# Patient Record
Sex: Female | Born: 1995 | Race: Black or African American | Hispanic: No | Marital: Single
Health system: Southern US, Community
[De-identification: ages and names within clinical notes are randomized; demographics above are authoritative.]

## PROBLEM LIST (undated history)

## (undated) DIAGNOSIS — F23 Brief psychotic disorder: Secondary | ICD-10-CM

---
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
## 2017-11-09 IMAGING — CT CT HEAD W/O CM
3 of 6 series · 15 of 47 positions shown, 18 images · non-contrast
Comparison: None.

CLINICAL DATA: Altered mental status over the past several days.

EXAM:
CT HEAD WITHOUT CONTRAST
TECHNIQUE: Contiguous axial images were obtained from the base of the skull
through the vertex without intravenous contrast.

[Series 2: head w/o · axial · non-contrast · 0.45mm/px · z∈[+1428,+1543]mm · 9 of 29 slices shown, 12 images]
[im 3/29  brain]
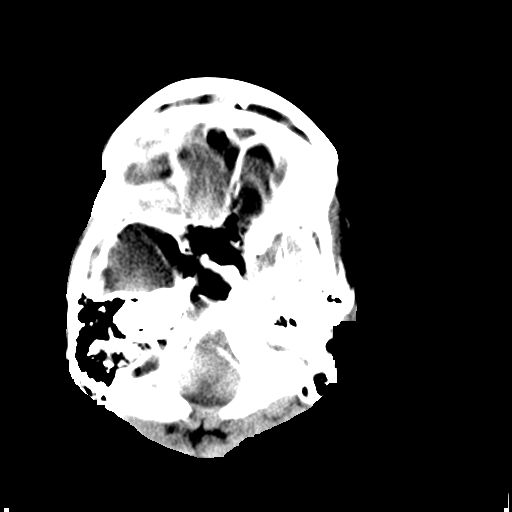
[im 3/29  bone]
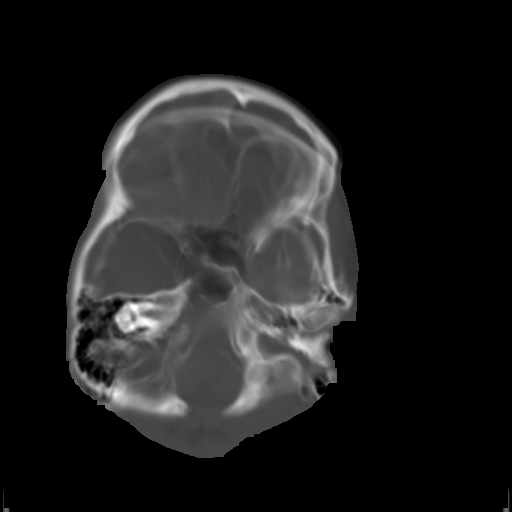
[im 5/29  brain]
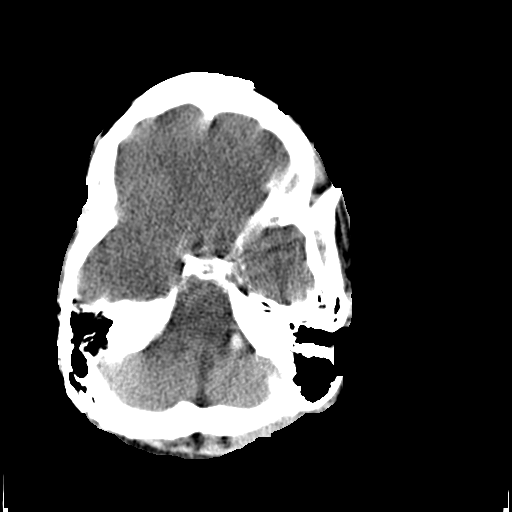
[im 10/29  brain]
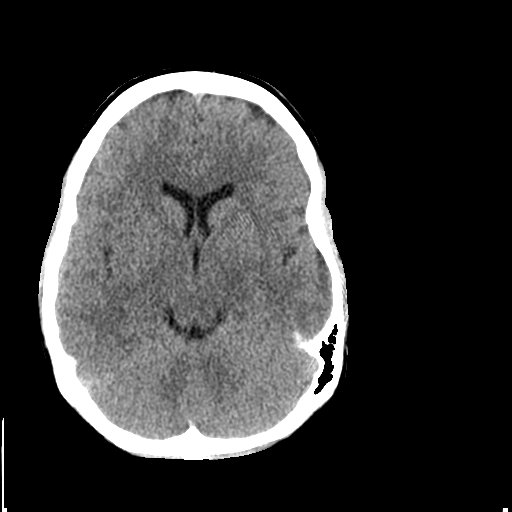
[im 12/29  brain]
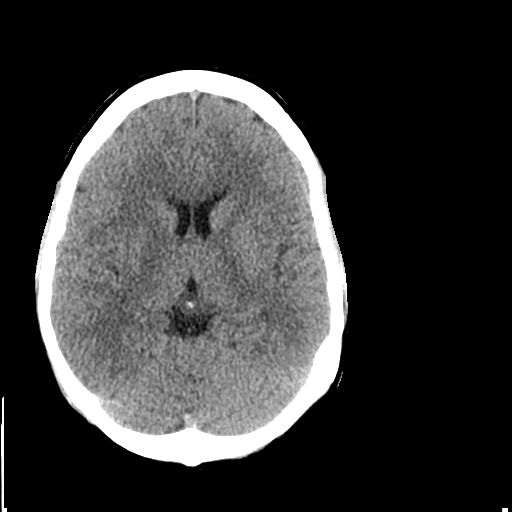
[im 15/29  brain]
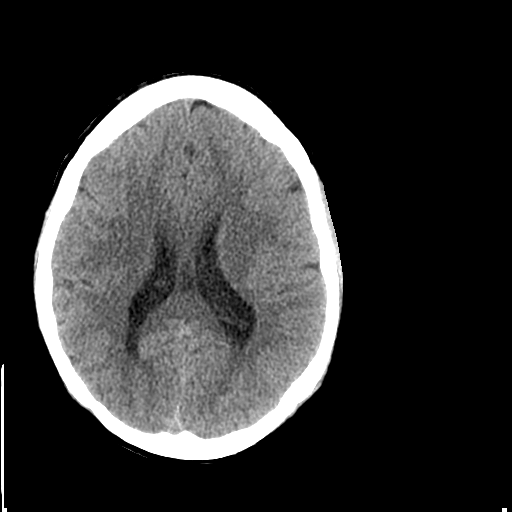
[im 15/29  bone]
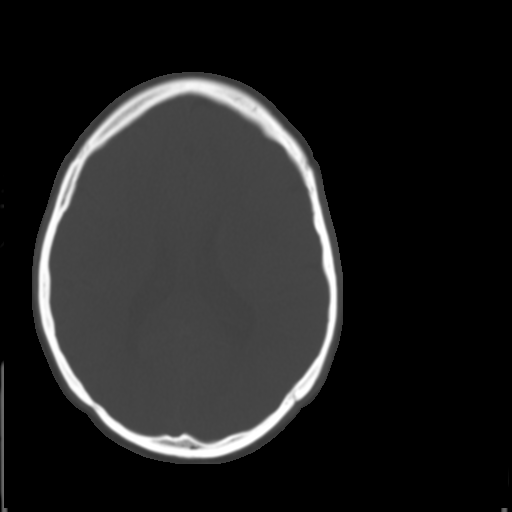
[im 17/29  brain]
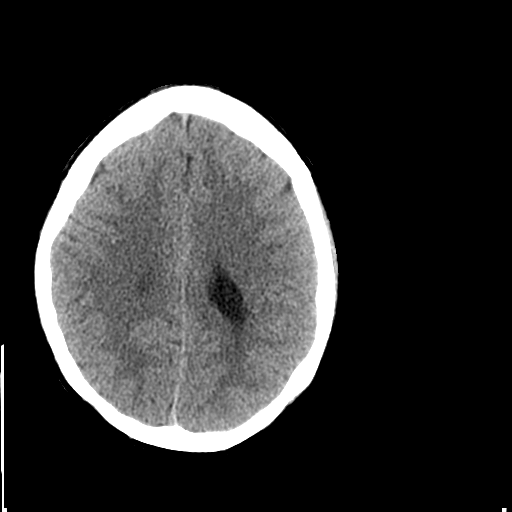
[im 19/29  brain]
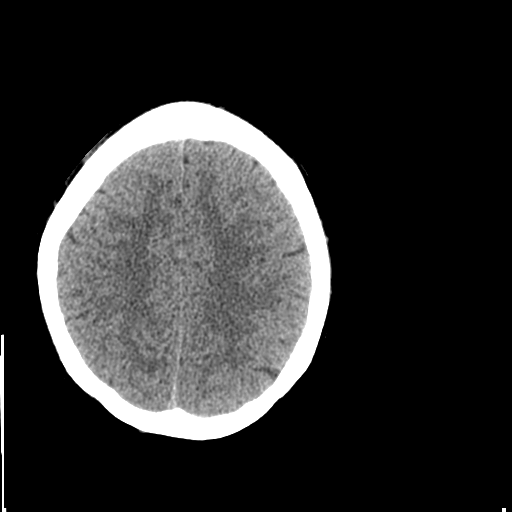
[im 24/29  brain]
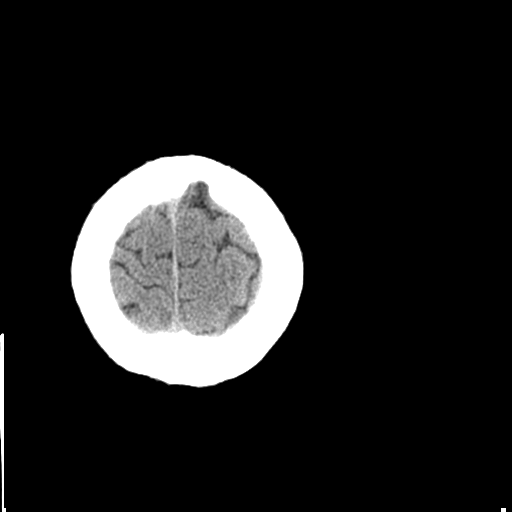
[im 26/29  brain]
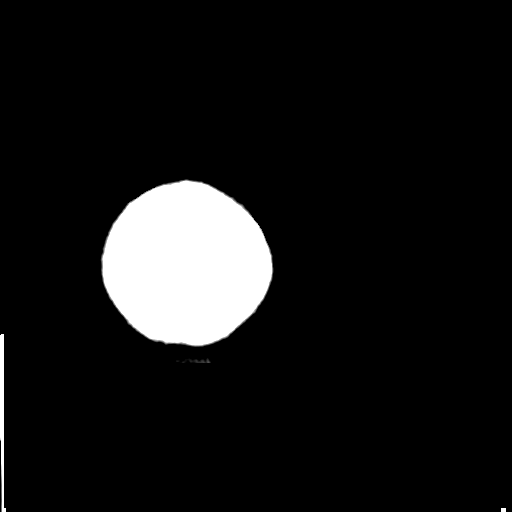
[im 26/29  bone]
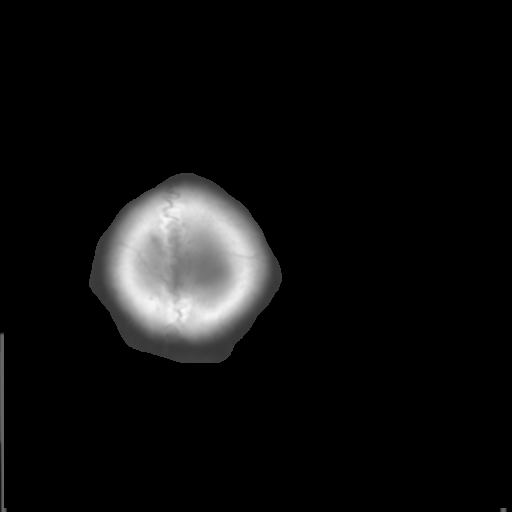

[Series 6: coronal · coronal · 0.29mm/px · 3 of 65 slices shown]
[im 22/65  brain]
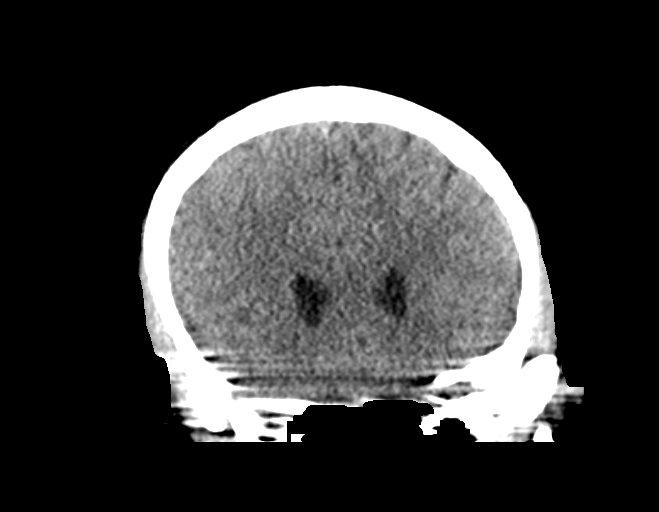
[im 29/65  brain]
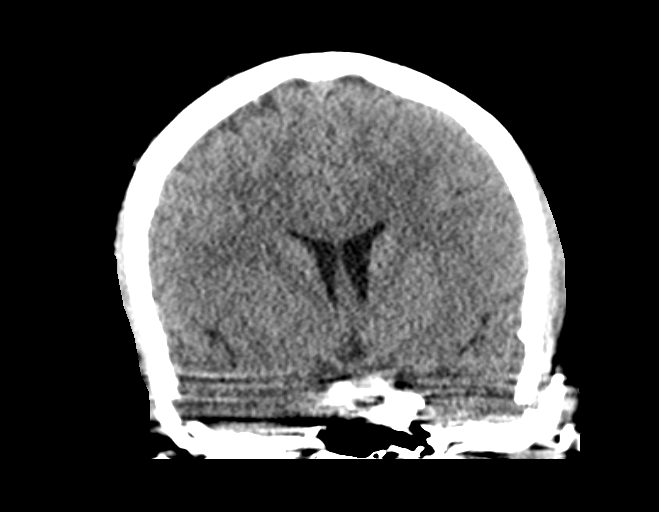
[im 36/65  brain]
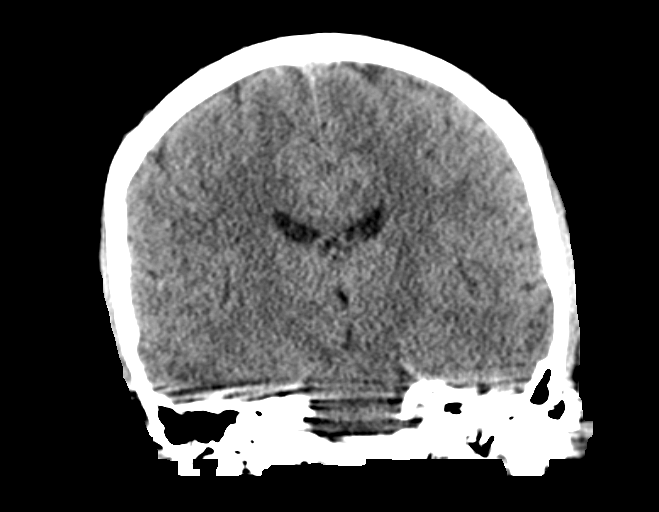

[Series 7: sagittal · sagittal · 0.34mm/px · 3 of 51 slices shown]
[im 17/51  brain]
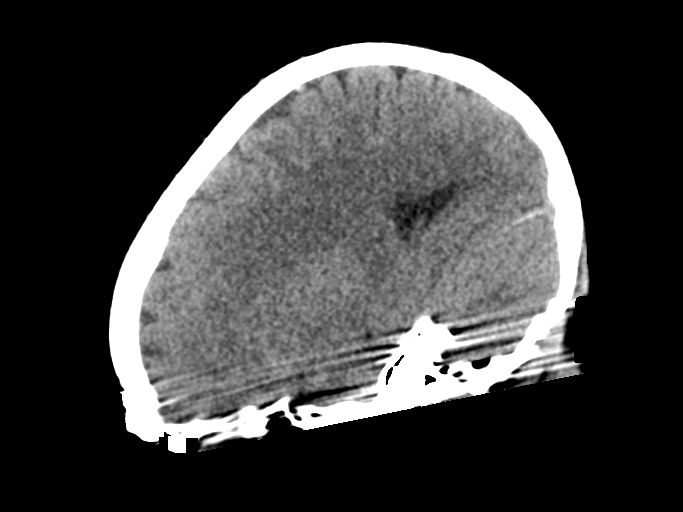
[im 26/51  brain]
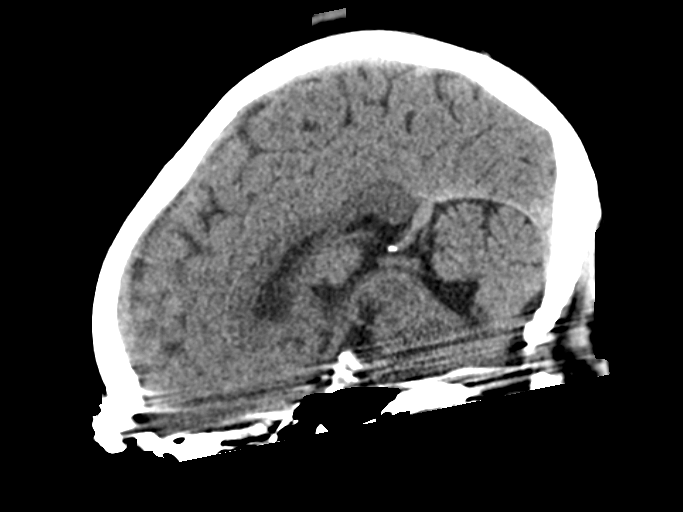
[im 34/51  brain]
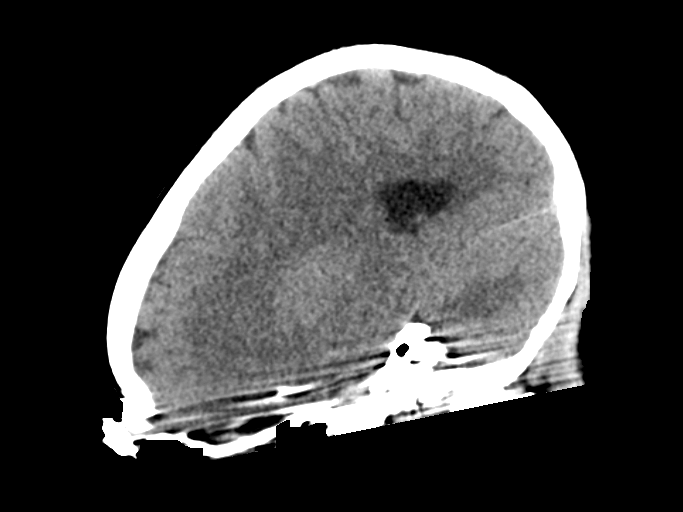

[15 of 47 positions shown; findings below may reference images not displayed]

FINDINGS: The brain appears normal without hemorrhage, infarct, mass lesion,
mass effect, midline shift or abnormal extra-axial fluid collection.
No hydrocephalus or pneumocephalus. Imaged paranasal sinuses and
mastoid air cells are clear. The calvarium is intact.
IMPRESSION: Negative head CT.
